# Patient Record
Sex: Female | Born: 1965 | Race: White | Hispanic: No | Marital: Married | Smoking: Never smoker
Health system: Southern US, Community
[De-identification: ages and names within clinical notes are randomized; demographics above are authoritative.]

---

## 1997-07-09 ENCOUNTER — Other Ambulatory Visit: Admission: RE | Admit: 1997-07-09 | Discharge: 1997-07-09 | Payer: Self-pay | Admitting: Obstetrics and Gynecology

## 1998-12-04 ENCOUNTER — Other Ambulatory Visit: Admission: RE | Admit: 1998-12-04 | Discharge: 1998-12-04 | Payer: Self-pay | Admitting: *Deleted

## 2000-02-18 ENCOUNTER — Other Ambulatory Visit: Admission: RE | Admit: 2000-02-18 | Discharge: 2000-02-18 | Payer: Self-pay | Admitting: Obstetrics and Gynecology

## 2001-04-26 ENCOUNTER — Other Ambulatory Visit: Admission: RE | Admit: 2001-04-26 | Discharge: 2001-04-26 | Payer: Self-pay | Admitting: Obstetrics and Gynecology

## 2003-02-06 ENCOUNTER — Other Ambulatory Visit: Admission: RE | Admit: 2003-02-06 | Discharge: 2003-02-06 | Payer: Self-pay | Admitting: Family Medicine

## 2005-01-08 ENCOUNTER — Other Ambulatory Visit: Admission: RE | Admit: 2005-01-08 | Discharge: 2005-01-08 | Payer: Self-pay | Admitting: Family Medicine

## 2006-05-05 ENCOUNTER — Other Ambulatory Visit: Admission: RE | Admit: 2006-05-05 | Discharge: 2006-05-05 | Payer: Self-pay | Admitting: Family Medicine

## 2007-07-13 ENCOUNTER — Other Ambulatory Visit: Admission: RE | Admit: 2007-07-13 | Discharge: 2007-07-13 | Payer: Self-pay | Admitting: Family Medicine

## 2008-12-24 ENCOUNTER — Other Ambulatory Visit: Admission: RE | Admit: 2008-12-24 | Discharge: 2008-12-24 | Payer: Self-pay | Admitting: Family Medicine

## 2008-12-27 ENCOUNTER — Ambulatory Visit (HOSPITAL_COMMUNITY): Admission: RE | Admit: 2008-12-27 | Discharge: 2008-12-27 | Payer: Self-pay | Admitting: Family Medicine

## 2010-01-13 ENCOUNTER — Other Ambulatory Visit: Admission: RE | Admit: 2010-01-13 | Discharge: 2010-01-13 | Payer: Self-pay | Admitting: Family Medicine

## 2011-02-04 IMAGING — RF DG ESOPHAGUS
19 of 23 series · 19 of 24 positions shown · non-contrast
Comparison: None

CLINICAL DATA: Dysphagia.

ESOPHOGRAM/BARIUM SWALLOW
TECHNIQUE: Combined double contrast and single contrast
examination performed using effervescent crystals, thick barium
liquid, and thin barium liquid.
Fluoroscopy time:  2.4 minutes.

[Series 1: run · 1 of 4 slices shown (1 of 19)]
[im 1/4]
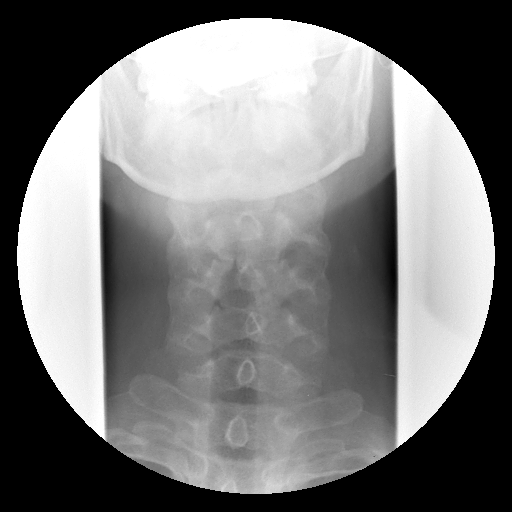

[Series 2: run · 1 of 4 slices shown (2 of 19)]
[im 1/4]
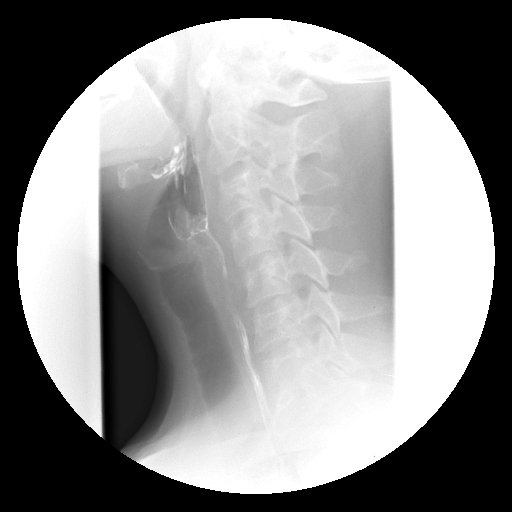

[Series 3: run · 1 of 1 slices shown (3 of 19)]
[im 1/1]
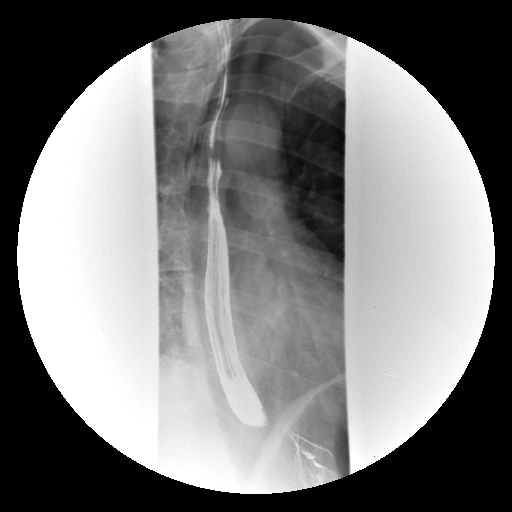

[Series 4: run · 1 of 1 slices shown (4 of 19)]
[im 1/1]
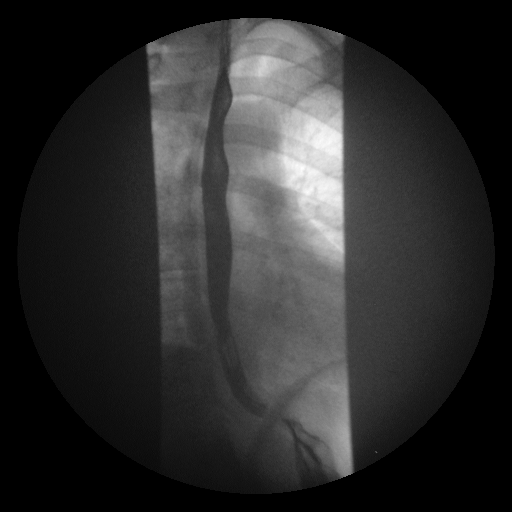

[Series 5: run · 1 of 1 slices shown (5 of 19)]
[im 1/1]
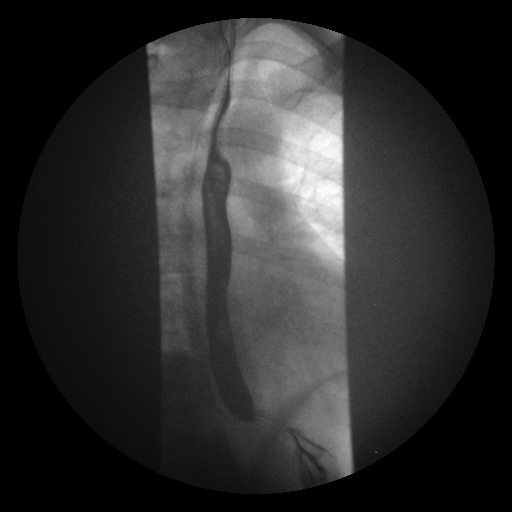

[Series 6: run · 1 of 1 slices shown (6 of 19)]
[im 1/1]
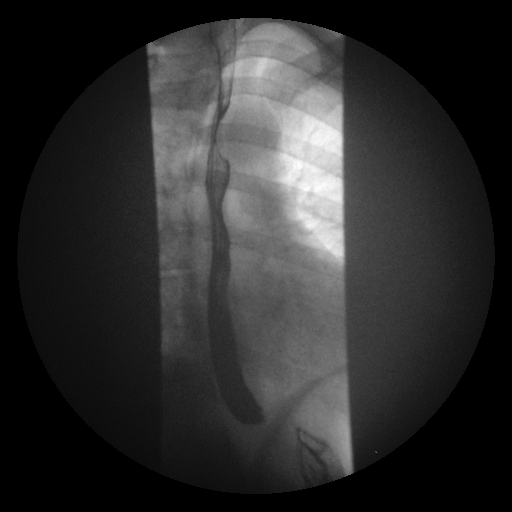

[Series 8: run · 1 of 1 slices shown (7 of 19)]
[im 1/1]
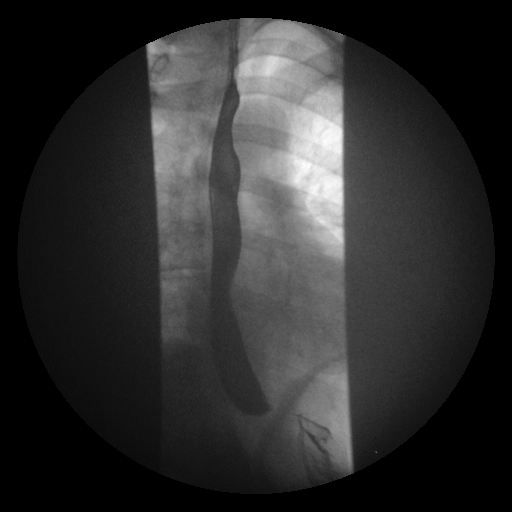

[Series 9: run · 1 of 1 slices shown (8 of 19)]
[im 1/1]
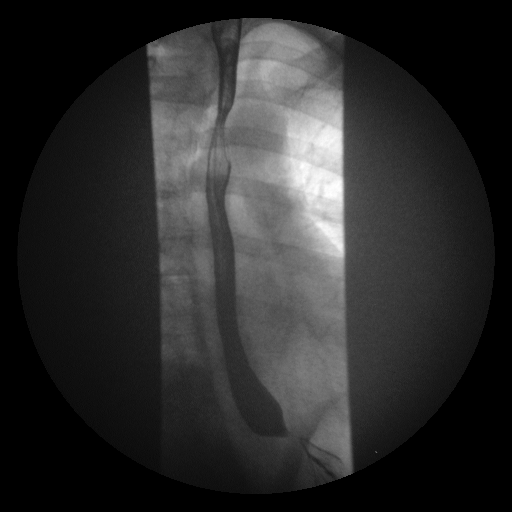

[Series 10: run · 1 of 1 slices shown (9 of 19)]
[im 1/1]
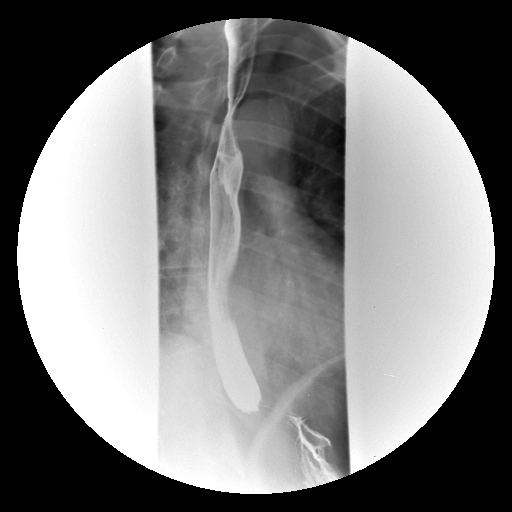

[Series 12: run · 1 of 1 slices shown (10 of 19)]
[im 1/1]
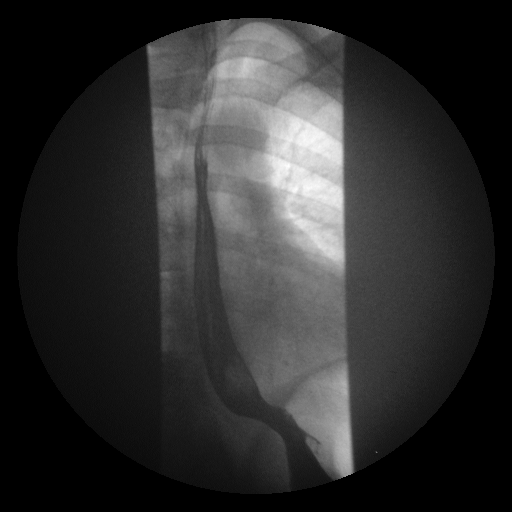

[Series 13: run · 1 of 1 slices shown (11 of 19)]
[im 1/1]
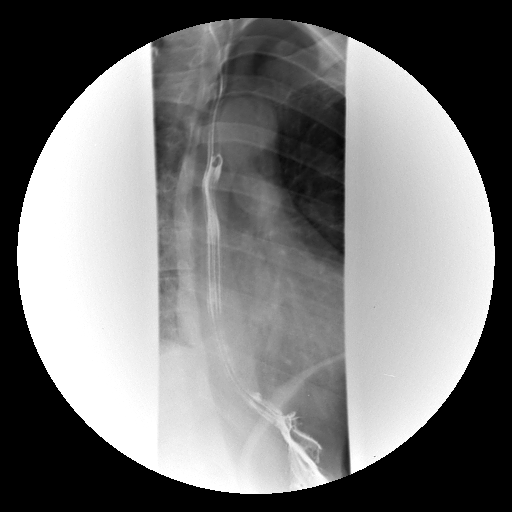

[Series 14: run · 1 of 1 slices shown (12 of 19)]
[im 1/1]
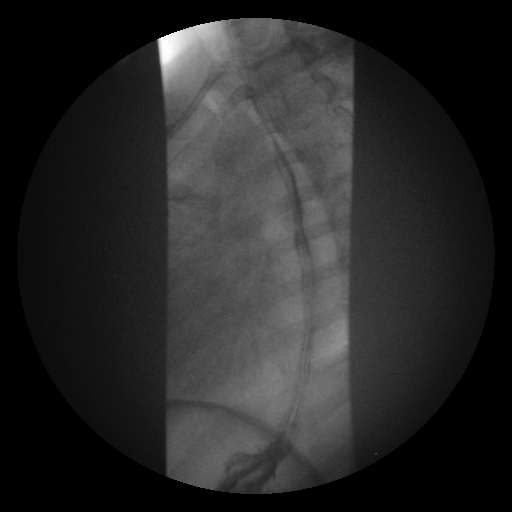

[Series 15: run · 1 of 1 slices shown (13 of 19)]
[im 1/1]
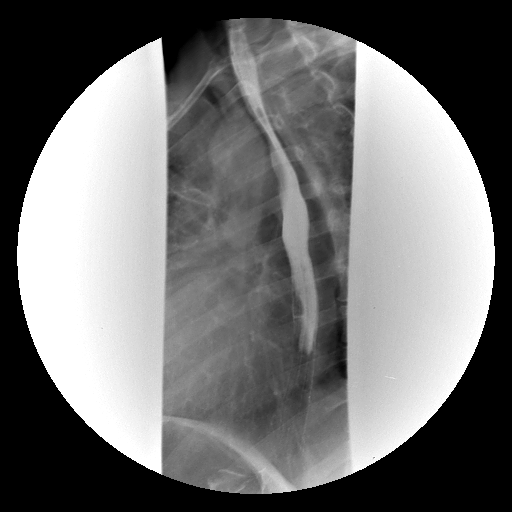

[Series 17: run · 1 of 1 slices shown (14 of 19)]
[im 1/1]
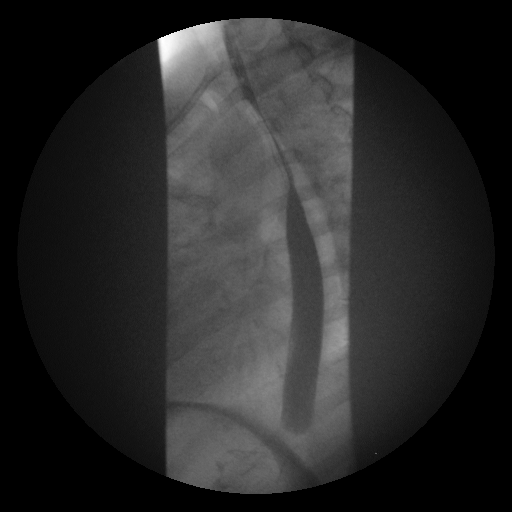

[Series 18: run · 1 of 1 slices shown (15 of 19)]
[im 1/1]
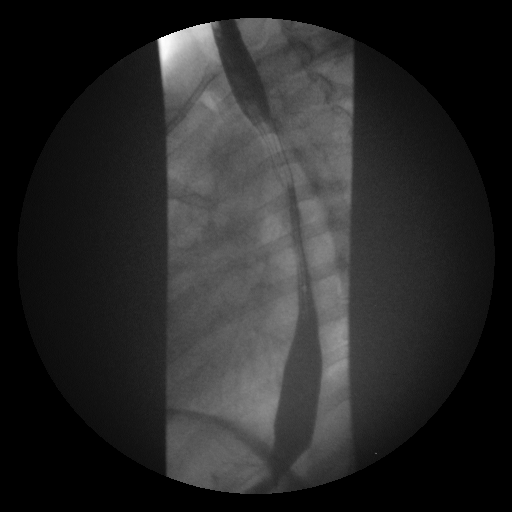

[Series 19: run · 1 of 1 slices shown (16 of 19)]
[im 1/1]
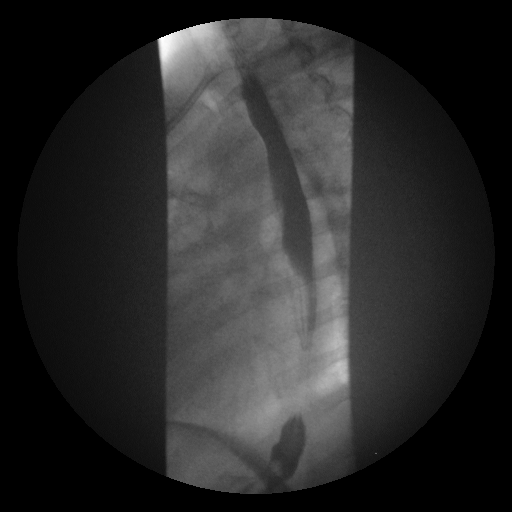

[Series 20: run · 1 of 1 slices shown (17 of 19)]
[im 1/1]
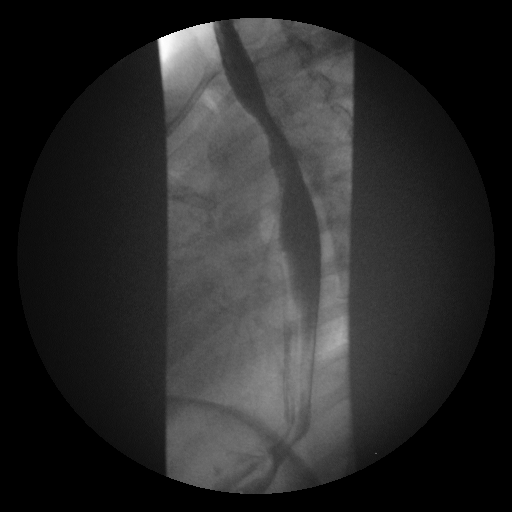

[Series 22: run · 1 of 1 slices shown (18 of 19)]
[im 1/1]
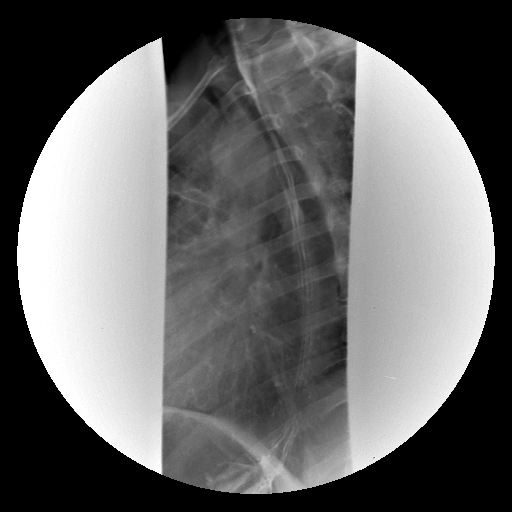

[Series 23: run · 1 of 1 slices shown (19 of 19)]
[im 1/1]
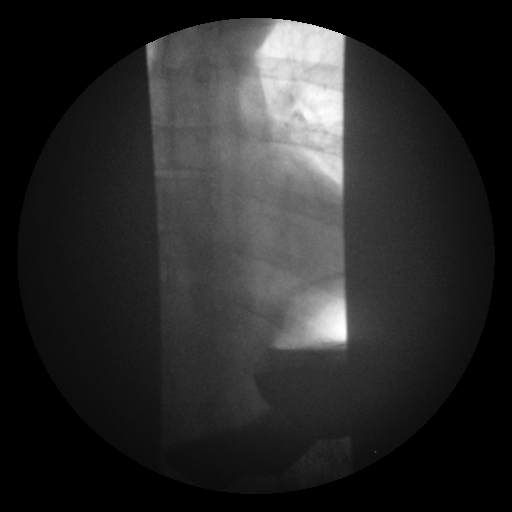

[19 of 24 positions shown; findings below may reference images not displayed]

FINDINGS: Fluoroscopic evaluation of swallowing shows normal
mucosal relief shots of the pharynx.  Normal cervical esophagus.
There is normal primary peristaltic waves within the esophagus.  No
stricture, fold thickening or mass.  No reflux elicited with the
water siphon maneuver.  The patient was able to swallow a 13 mm
barium tablet which freely passed into the stomach.
IMPRESSION: Unremarkable double contrast esophagram.

## 2013-06-23 ENCOUNTER — Encounter: Payer: Self-pay | Admitting: Podiatrist

## 2013-06-23 ENCOUNTER — Ambulatory Visit (INDEPENDENT_AMBULATORY_CARE_PROVIDER_SITE_OTHER): Payer: 59 | Admitting: Podiatrist

## 2013-06-23 ENCOUNTER — Ambulatory Visit (INDEPENDENT_AMBULATORY_CARE_PROVIDER_SITE_OTHER): Payer: 59

## 2013-06-23 VITALS — BP 134/79 | HR 72 | Resp 12

## 2013-06-23 DIAGNOSIS — R52 Pain, unspecified: Secondary | ICD-10-CM

## 2013-06-23 DIAGNOSIS — M722 Plantar fascial fibromatosis: Secondary | ICD-10-CM

## 2013-06-23 DIAGNOSIS — M205X1 Other deformities of toe(s) (acquired), right foot: Secondary | ICD-10-CM

## 2013-06-23 DIAGNOSIS — M205X9 Other deformities of toe(s) (acquired), unspecified foot: Secondary | ICD-10-CM

## 2013-06-23 NOTE — Patient Instructions (Addendum)
If you are interested in surgery, please call our scheduler-  Her name is Rachael Bass.  She will be happy to get your surgery scheduled and we will have you in to sign consent forms at that time as well   Plantar Fasciitis (Heel Spur Syndrome) with Rehab The plantar fascia is a fibrous, ligament-like, soft-tissue structure that spans the bottom of the foot. Plantar fasciitis is a condition that causes pain in the foot due to inflammation of the tissue. SYMPTOMS   Pain and tenderness on the underneath side of the foot.  Pain that worsens with standing or walking. CAUSES  Plantar fasciitis is caused by irritation and injury to the plantar fascia on the underneath side of the foot. Common mechanisms of injury include:  Direct trauma to bottom of the foot.  Damage to a small nerve that runs under the foot where the main fascia attaches to the heel bone. Stress placed on the plantar fascia due to any mild increased activity or injury RISK INCREASES WITH:   Obesity.  Poor strength and flexibility.  Improperly fitted shoes.  Tight calf muscles.  Flat feet.  Failure to warm-up properly before activity.  PREVENTION  Warm up and stretch properly before activity.  Strength, flexibility  Maintain a health body weight.  Avoid stress on the plantar fascia.  Wear properly fitted shoes, including arch supports for individuals who have flat feet. PROGNOSIS  If treated properly, then the symptoms of plantar fasciitis usually resolve without surgery. However, occasionally surgery is necessary. RELATED COMPLICATIONS   Recurrent symptoms that may result in a chronic condition.  Problems of the lower back that are caused by compensating for the injury, such as limping.  Pain or weakness of the foot during push-off following surgery.  Chronic inflammation, scarring, and partial or complete fascia tear, occurring more often from repeated injections. TREATMENT  Treatment initially  involves the use of ice and medication to help reduce pain and inflammation. The use of strengthening and stretching exercises may help reduce pain with activity, especially stretches of the Achilles tendon.  Your caregiver may recommend that you use arch supports to help reduce stress on the plantar fascia. Often, corticosteroid injections are given to reduce inflammation. If symptoms persist for greater than 6 months despite non-surgical (conservative), then surgery may be recommended.  MEDICATION   If pain medication is necessary, then nonsteroidal anti-inflammatory medications, such as aspirin and ibuprofen, or other minor pain relievers, such as acetaminophen, are often recommended. Corticosteroid injections may be given by your caregiver.  HEAT AND COLD  Cold treatment (icing) relieves pain and reduces inflammation. Cold treatment should be applied for 10 to 15 minutes every 2 to 3 hours for inflammation and pain and immediately after any activity that aggravates your symptoms. Use ice packs or massage the area with a piece of ice (ice massage).  Heat treatment may be used prior to performing the stretching and strengthening activities prescribed by your caregiver, physical therapist, or athletic trainer. Use a heat pack or soak the injury in warm water. SEEK IMMEDIATE MEDICAL CARE IF:  Treatment seems to offer no benefit, or the condition worsens.  Any medications produce adverse side effects.    EXERCISES-- perform each exercise a total of 10-15 repetitions.  Hold for 30 seconds and perform 3 times per day   RANGE OF MOTION (ROM) AND STRETCHING EXERCISES - Plantar Fasciitis (Heel Spur Syndrome) These exercises may help you when beginning to rehabilitate your injury.   While completing these  exercises, remember:   Restoring tissue flexibility helps normal motion to return to the joints. This allows healthier, less painful movement and activity.  An effective stretch should be held  for at least 30 seconds.  A stretch should never be painful. You should only feel a gentle lengthening or release in the stretched tissue. RANGE OF MOTION - Toe Extension, Flexion  Sit with your right / left leg crossed over your opposite knee.  Grasp your toes and gently pull them back toward the top of your foot. You should feel a stretch on the bottom of your toes and/or foot.  Hold this stretch for __________ seconds.  Now, gently pull your toes toward the bottom of your foot. You should feel a stretch on the top of your toes and or foot.  Hold this stretch for __________ seconds. Repeat __________ times. Complete this stretch __________ times per day.  RANGE OF MOTION - Ankle Dorsiflexion, Active Assisted  Remove shoes and sit on a chair that is preferably not on a carpeted surface.  Place right / left foot under knee. Extend your opposite leg for support.  Keeping your heel down, slide your right / left foot back toward the chair until you feel a stretch at your ankle or calf. If you do not feel a stretch, slide your bottom forward to the edge of the chair, while still keeping your heel down.  Hold this stretch for __________ seconds. Repeat __________ times. Complete this stretch __________ times per day.  STRETCH  Gastroc, Standing  Place hands on wall.  Extend right / left leg, keeping the front knee somewhat bent.  Slightly point your toes inward on your back foot.  Keeping your right / left heel on the floor and your knee straight, shift your weight toward the wall, not allowing your back to arch.  You should feel a gentle stretch in the right / left calf. Hold this position for __________ seconds. Repeat __________ times. Complete this stretch __________ times per day. STRETCH  Soleus, Standing  Place hands on wall.  Extend right / left leg, keeping the other knee somewhat bent.  Slightly point your toes inward on your back foot.  Keep your right / left heel  on the floor, bend your back knee, and slightly shift your weight over the back leg so that you feel a gentle stretch deep in your back calf.  Hold this position for __________ seconds. Repeat __________ times. Complete this stretch __________ times per day. STRETCH  Gastrocsoleus, Standing  Note: This exercise can place a lot of stress on your foot and ankle. Please complete this exercise only if specifically instructed by your caregiver.   Place the ball of your right / left foot on a step, keeping your other foot firmly on the same step.  Hold on to the wall or a rail for balance.  Slowly lift your other foot, allowing your body weight to press your heel down over the edge of the step.  You should feel a stretch in your right / left calf.  Hold this position for __________ seconds.  Repeat this exercise with a slight bend in your right / left knee. Repeat __________ times. Complete this stretch __________ times per day.  STRENGTHENING EXERCISES - Plantar Fasciitis (Heel Spur Syndrome)  These exercises may help you when beginning to rehabilitate your injury. They may resolve your symptoms with or without further involvement from your physician, physical therapist or athletic trainer. While completing these exercises,  remember:   Muscles can gain both the endurance and the strength needed for everyday activities through controlled exercises.  Complete these exercises as instructed by your physician, physical therapist or athletic trainer. Progress the resistance and repetitions only as guided.

## 2013-06-23 NOTE — Progress Notes (Signed)
   Subjective:    Patient ID: Rachael Bass, female    DOB: November 14, 1965, 48 y.o.   MRN: [id number]  HPI  PT STATED B/L BUNION AND ARCH PAIN FOR 4 MONTHS. THE FEET ARE GETTING BETTER. THE FEET GET AGGRAVATED WALKING AND PRESSURE. TRIED NO TREATMENT.   Review of Systems  All other systems reviewed and are negative.      Objective:   Physical Exam  GENERAL APPEARANCE: Alert, conversant. Appropriately groomed. No acute distress.  VASCULAR: Pedal pulses palpable at 2/4 DP and PT bilateral.  Capillary refill time is immediate to all digits,  Proximal to distal cooling it warm to warm.  Digital hair growth is present bilateral  NEUROLOGIC: sensation is intact epicritically and protectively to 5.07 monofilament at 5/5 sites bilateral.  Light touch is intact bilateral, vibratory sensation intact bilateral, achilles tendon reflex is intact bilateral.  MUSCULOSKELETAL: large dorsal spur on the right first metatarsal head.  Range of motion is adequate in this joint-- pain on end range of motion and with direct pressure is noted right.  Left foot is normal.  Mild pain on palpation bilateral heels consistent with mild plantar fasciitis bilateral which is slowly improving with change in shoegear.   DERMATOLOGIC: skin color, texture, and turger are within normal limits.  No preulcerative lesions are seen, no interdigital maceration noted.  No open lesions present.  Digital nails are asymptomatic.     Assessment & Plan:  Hallux limitus right; mild plantar fasciitis bilateral  Plan:  Discussed treatment options and alternatives. Discussed wider shoes, changing shoes to accommodate for her painful dorsal spur. Ultimately recommended surgery to remove the painful dorsal spur.  Surgery would consist of a simple exostectomy since her range of motion is still good within the joint and the only uncomfortable area is the spur itself abutting shoegear.  I also recommended stretches and antiinflammatories for the  resolving plantar fasciitis.  She will consider surgery and if she elects to have it done will be seen for signing of consents.

## 2014-04-12 ENCOUNTER — Other Ambulatory Visit (HOSPITAL_COMMUNITY)
Admission: RE | Admit: 2014-04-12 | Discharge: 2014-04-12 | Disposition: A | Discharge status: Home / Self Care | Payer: BLUE CROSS/BLUE SHIELD | Source: Ambulatory Visit | Attending: Family Medicine | Admitting: Family Medicine

## 2014-04-12 ENCOUNTER — Other Ambulatory Visit: Payer: Self-pay | Admitting: Family Medicine

## 2014-04-12 DIAGNOSIS — Z01419 Encounter for gynecological examination (general) (routine) without abnormal findings: Secondary | ICD-10-CM | POA: Diagnosis present

## 2014-04-17 LAB — CYTOLOGY - PAP

## 2015-09-25 DIAGNOSIS — Z0000 Encounter for general adult medical examination without abnormal findings: Secondary | ICD-10-CM | POA: Diagnosis not present

## 2015-09-25 DIAGNOSIS — Z13220 Encounter for screening for lipoid disorders: Secondary | ICD-10-CM | POA: Diagnosis not present

## 2015-09-25 DIAGNOSIS — E039 Hypothyroidism, unspecified: Secondary | ICD-10-CM | POA: Diagnosis not present

## 2016-04-28 DIAGNOSIS — H40053 Ocular hypertension, bilateral: Secondary | ICD-10-CM | POA: Diagnosis not present

## 2016-07-03 DIAGNOSIS — Z1231 Encounter for screening mammogram for malignant neoplasm of breast: Secondary | ICD-10-CM | POA: Diagnosis not present

## 2016-07-03 DIAGNOSIS — Z803 Family history of malignant neoplasm of breast: Secondary | ICD-10-CM | POA: Diagnosis not present

## 2016-10-07 DIAGNOSIS — E039 Hypothyroidism, unspecified: Secondary | ICD-10-CM | POA: Diagnosis not present

## 2016-10-07 DIAGNOSIS — N943 Premenstrual tension syndrome: Secondary | ICD-10-CM | POA: Diagnosis not present

## 2016-12-03 DIAGNOSIS — Z23 Encounter for immunization: Secondary | ICD-10-CM | POA: Diagnosis not present

## 2017-07-09 DIAGNOSIS — Z1231 Encounter for screening mammogram for malignant neoplasm of breast: Secondary | ICD-10-CM | POA: Diagnosis not present

## 2017-07-09 DIAGNOSIS — Z803 Family history of malignant neoplasm of breast: Secondary | ICD-10-CM | POA: Diagnosis not present

## 2017-12-06 DIAGNOSIS — Z23 Encounter for immunization: Secondary | ICD-10-CM | POA: Diagnosis not present

## 2018-03-28 DIAGNOSIS — R05 Cough: Secondary | ICD-10-CM | POA: Diagnosis not present

## 2018-04-18 DIAGNOSIS — Z1211 Encounter for screening for malignant neoplasm of colon: Secondary | ICD-10-CM | POA: Diagnosis not present

## 2018-04-18 DIAGNOSIS — F419 Anxiety disorder, unspecified: Secondary | ICD-10-CM | POA: Diagnosis not present

## 2018-04-18 DIAGNOSIS — E039 Hypothyroidism, unspecified: Secondary | ICD-10-CM | POA: Diagnosis not present
